# Patient Record
Sex: Female | Born: 1960 | Race: White | Hispanic: No | Marital: Married
Health system: Southern US, Community
[De-identification: ages and names within clinical notes are randomized; demographics above are authoritative.]

## PROBLEM LIST (undated history)

## (undated) HISTORY — PX: BREAST BIOPSY: SHX20

---

## 1998-11-21 ENCOUNTER — Inpatient Hospital Stay (HOSPITAL_COMMUNITY): Admission: AD | Admit: 1998-11-21 | Discharge: 1998-11-23 | Payer: Self-pay | Admitting: Obstetrics and Gynecology

## 1999-01-01 ENCOUNTER — Other Ambulatory Visit: Admission: RE | Admit: 1999-01-01 | Discharge: 1999-01-01 | Payer: Self-pay | Admitting: Obstetrics and Gynecology

## 2000-04-22 ENCOUNTER — Other Ambulatory Visit: Admission: RE | Admit: 2000-04-22 | Discharge: 2000-04-22 | Payer: Self-pay | Admitting: Obstetrics and Gynecology

## 2001-06-25 ENCOUNTER — Other Ambulatory Visit: Admission: RE | Admit: 2001-06-25 | Discharge: 2001-06-25 | Payer: Self-pay | Admitting: Obstetrics and Gynecology

## 2002-11-04 ENCOUNTER — Other Ambulatory Visit: Admission: RE | Admit: 2002-11-04 | Discharge: 2002-11-04 | Payer: Self-pay | Admitting: Obstetrics and Gynecology

## 2003-12-15 ENCOUNTER — Other Ambulatory Visit: Admission: RE | Admit: 2003-12-15 | Discharge: 2003-12-15 | Payer: Self-pay | Admitting: Obstetrics and Gynecology

## 2005-01-29 ENCOUNTER — Other Ambulatory Visit: Admission: RE | Admit: 2005-01-29 | Discharge: 2005-01-29 | Payer: Self-pay | Admitting: Obstetrics and Gynecology

## 2005-02-05 ENCOUNTER — Encounter: Admission: RE | Admit: 2005-02-05 | Discharge: 2005-02-05 | Payer: Self-pay | Admitting: Obstetrics and Gynecology

## 2006-02-23 ENCOUNTER — Other Ambulatory Visit: Admission: RE | Admit: 2006-02-23 | Discharge: 2006-02-23 | Payer: Self-pay | Admitting: Obstetrics and Gynecology

## 2006-07-22 ENCOUNTER — Encounter: Admission: RE | Admit: 2006-07-22 | Discharge: 2006-07-22 | Payer: Self-pay | Admitting: Obstetrics and Gynecology

## 2007-06-02 ENCOUNTER — Other Ambulatory Visit: Admission: RE | Admit: 2007-06-02 | Discharge: 2007-06-02 | Payer: Self-pay | Admitting: Obstetrics and Gynecology

## 2007-08-03 ENCOUNTER — Encounter: Admission: RE | Admit: 2007-08-03 | Discharge: 2007-08-03 | Payer: Self-pay | Admitting: Obstetrics and Gynecology

## 2007-08-10 ENCOUNTER — Encounter: Admission: RE | Admit: 2007-08-10 | Discharge: 2007-08-10 | Payer: Self-pay | Admitting: Obstetrics and Gynecology

## 2008-07-20 ENCOUNTER — Other Ambulatory Visit: Admission: RE | Admit: 2008-07-20 | Discharge: 2008-07-20 | Payer: Self-pay | Admitting: Obstetrics and Gynecology

## 2008-08-09 ENCOUNTER — Encounter: Admission: RE | Admit: 2008-08-09 | Discharge: 2008-08-09 | Payer: Self-pay | Admitting: Obstetrics and Gynecology

## 2008-10-23 ENCOUNTER — Emergency Department (HOSPITAL_BASED_OUTPATIENT_CLINIC_OR_DEPARTMENT_OTHER): Admission: EM | Admit: 2008-10-23 | Discharge: 2008-10-23 | Payer: Self-pay | Admitting: Emergency Medicine

## 2009-06-26 ENCOUNTER — Ambulatory Visit: Payer: Self-pay | Admitting: Vascular Surgery

## 2009-08-21 ENCOUNTER — Encounter: Admission: RE | Admit: 2009-08-21 | Discharge: 2009-08-21 | Payer: Self-pay | Admitting: Obstetrics and Gynecology

## 2009-08-24 ENCOUNTER — Other Ambulatory Visit: Admission: RE | Admit: 2009-08-24 | Discharge: 2009-08-24 | Payer: Self-pay | Admitting: Obstetrics and Gynecology

## 2009-09-27 ENCOUNTER — Encounter: Admission: RE | Admit: 2009-09-27 | Discharge: 2009-09-27 | Payer: Self-pay | Admitting: Family Medicine

## 2009-09-27 IMAGING — CR DG ANKLE 2V *R*
2 series · 2 of 2 positions shown · non-contrast
Comparison: None

CLINICAL DATA: Lateral ankle pain and swelling following twisting
injury 2 days ago.  Deltoid ligament sprain.

RIGHT ANKLE - 2 VIEW

[view not recorded (1 of 2)]
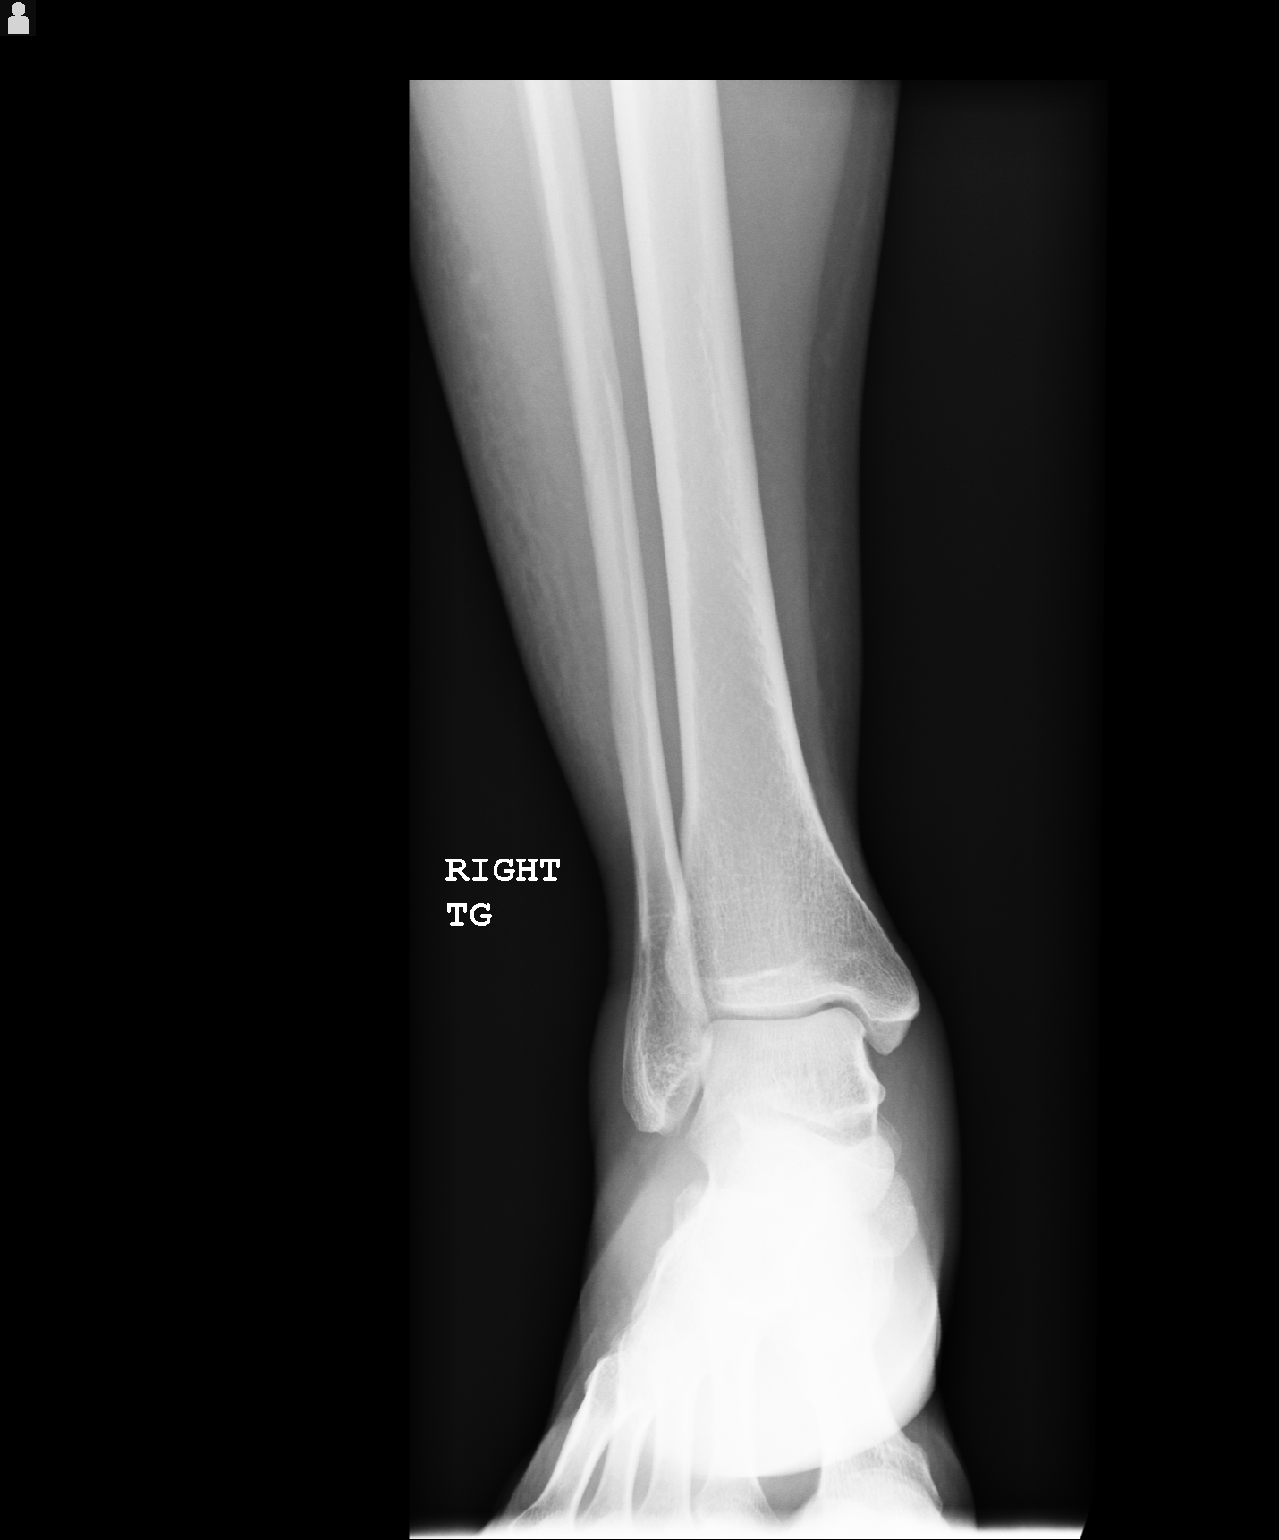

[view not recorded (2 of 2)]
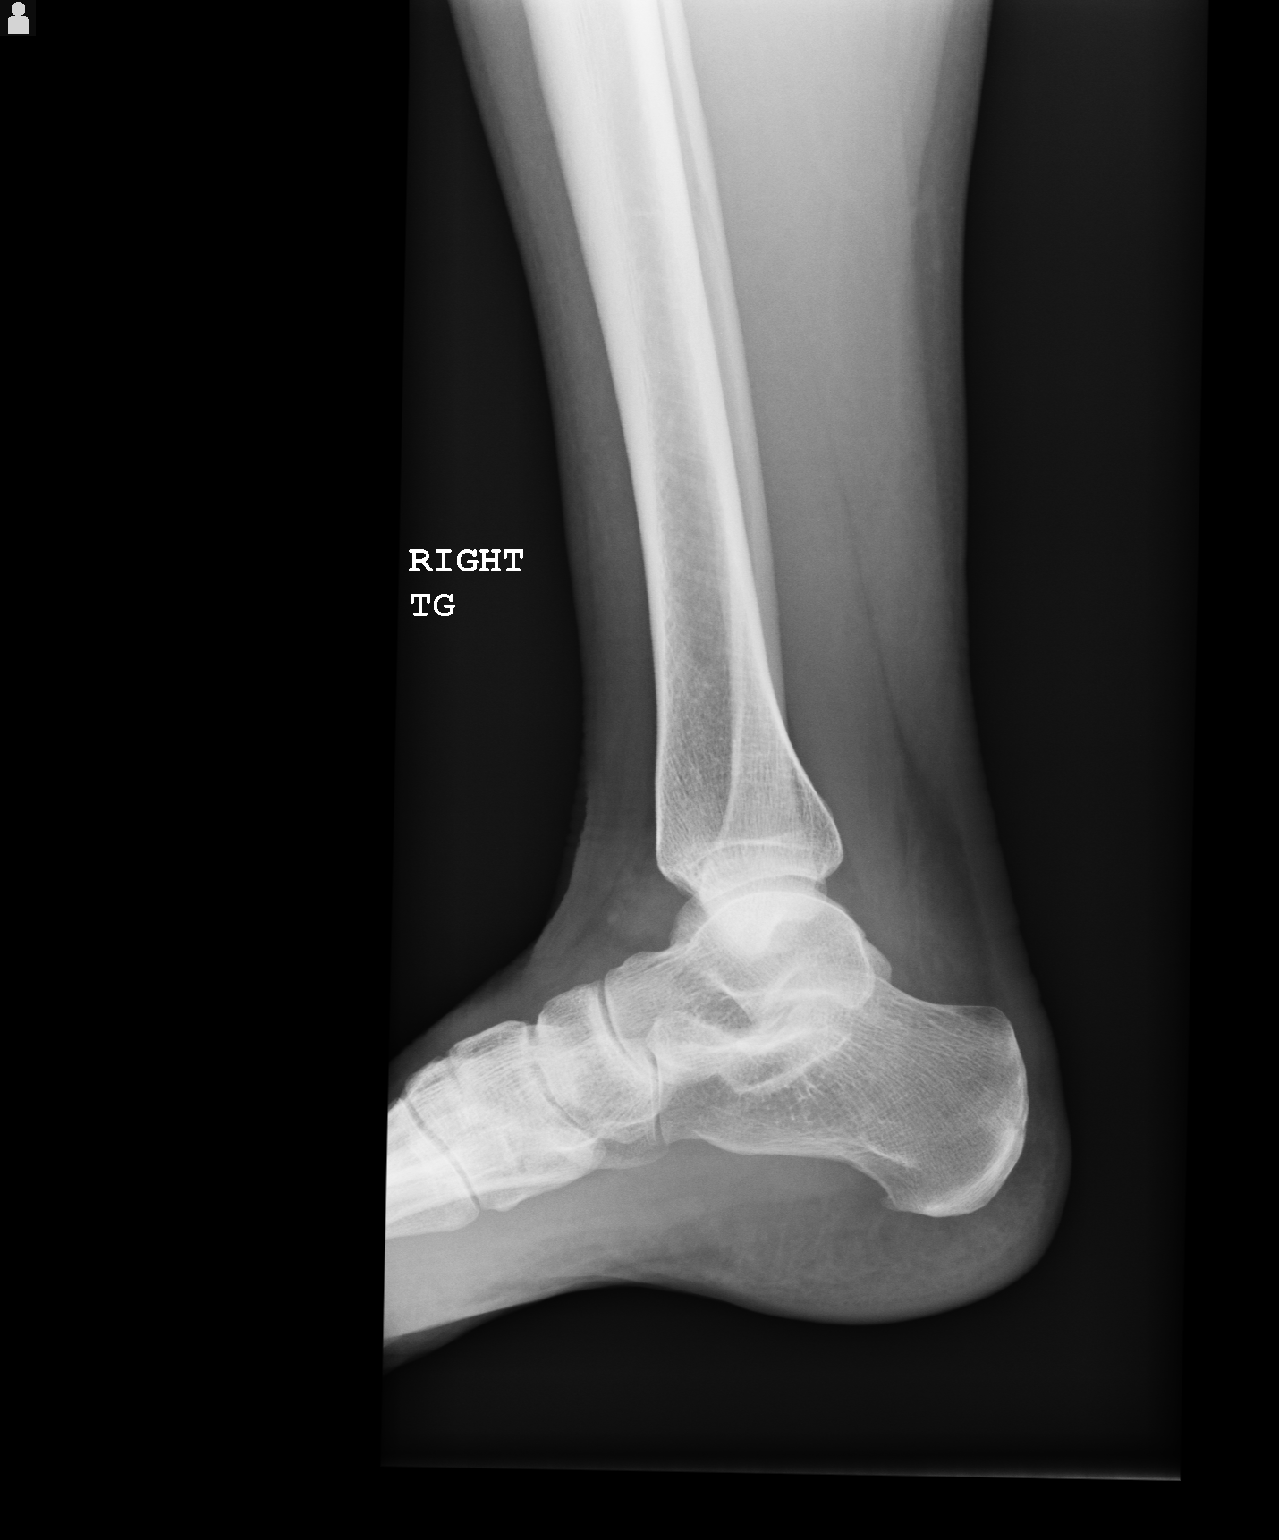

[2 of 2 positions shown; findings below may reference images not displayed]

FINDINGS: Mineralization and alignment are normal.  There is no
evidence of acute fracture or dislocation.  The joint spaces are
preserved.  There is a probable small ankle joint effusion.  Mild
lateral soft tissue swelling is noted at the ankle and in the
distal lower leg.  There may be mild swelling medially in the
region of the deltoid ligament as well.
IMPRESSION: No acute osseous findings.  Possible soft tissue swelling.

## 2009-10-11 ENCOUNTER — Ambulatory Visit: Payer: Self-pay | Admitting: Vascular Surgery

## 2010-08-22 ENCOUNTER — Encounter: Admission: RE | Admit: 2010-08-22 | Discharge: 2010-08-22 | Payer: Self-pay | Admitting: Obstetrics and Gynecology

## 2011-01-19 ENCOUNTER — Encounter: Payer: Self-pay | Admitting: Obstetrics and Gynecology

## 2011-05-13 NOTE — Consult Note (Signed)
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  She states over the last  5-10 years she has had some spider veins which have become more obvious  in the anterior thigh regions bilaterally as well as a string of veins  in the left posterior thigh extending around the knee.  She denies any  aching, throbbing, burning or stinging discomfort associated with this.  She has no distal edema, has no history of DVT or thrombophlebitis and  has had no history of ulceration or bleeding.  She does not wear elastic  compression stockings and has had no previous evaluation or treatment  for these.   PAST MEDICAL HISTORY:  Negative for diabetes, hypertension, coronary  artery disease, COPD or stroke.   PAST SURGICAL HISTORY:  1. C-section.  2. Tubal ligation.   FAMILY HISTORY:  Positive for varicose veins in her mother and sister  who has had a vein stripping.  Positive for diabetes in her mother and  coronary artery disease with surgery in her father.   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  Does not use tobacco or alcohol.   REVIEW OF SYSTEMS:  Totally unremarkable, ambulates long distances  without discomfort.  Otherwise, totally negative.   ALLERGIES:  None known.   MEDICATIONS:  Please see health history.   PHYSICAL EXAMINATION:  Vital signs:  Blood pressure 110/76, heart rate  65, respirations 14.  General:  She is a healthy-appearing middle-aged  female in no apparent distress, alert and oriented x3.  Neck:  Supple,  3+ carotid pulses palpable.  No bruits are audible.  Neurological:  Normal.  No palpable adenopathy in her neck.  Chest:  Clear to  auscultation.  Cardiovascular:  Regular rhythm.  No murmurs.  Abdomen:  Soft,  nontender with no masses.  She has 3+ femoral, popliteal and  dorsalis pedis pulses bilaterally.  Lower extremity exam reveals  prominent spider veins in both anterior thighs as well as a blush of  spider veins in the left pretibial region between the knee and the  ankle.  She also has large reticular or small varicose vein which begins  in the pretibial region on the left and extends up lateral to the knee  and across the posterior thigh area.  There are no medial varicosities  over the greater saphenous system and no varicosities over the lesser  saphenous system.  No hyperpigmentation or ulceration is noted.   I do not think she has severe valvular reflux based on her physical exam  and the best treatment for these would be sclerotherapy which we will  discuss at length with her today.  She would like to proceed and we will  schedule that in the near future.   Quita Skye Hart [place], M.D.  Electronically Signed   JDL/MEDQ  D:  06/26/2009  T:  06/27/2009  Job:  [id number]

## 2011-08-04 ENCOUNTER — Other Ambulatory Visit (HOSPITAL_COMMUNITY)
Admission: RE | Admit: 2011-08-04 | Discharge: 2011-08-04 | Disposition: A | Discharge status: Home / Self Care | Payer: PRIVATE HEALTH INSURANCE | Source: Ambulatory Visit | Attending: Obstetrics and Gynecology | Admitting: Obstetrics and Gynecology

## 2011-08-04 DIAGNOSIS — Z01419 Encounter for gynecological examination (general) (routine) without abnormal findings: Secondary | ICD-10-CM | POA: Insufficient documentation

## 2011-08-13 ENCOUNTER — Other Ambulatory Visit: Payer: Self-pay | Admitting: Obstetrics and Gynecology

## 2011-08-13 DIAGNOSIS — Z1231 Encounter for screening mammogram for malignant neoplasm of breast: Secondary | ICD-10-CM

## 2011-09-11 ENCOUNTER — Ambulatory Visit
Admission: RE | Admit: 2011-09-11 | Discharge: 2011-09-11 | Disposition: A | Discharge status: Home / Self Care | Payer: PRIVATE HEALTH INSURANCE | Source: Ambulatory Visit | Attending: Obstetrics and Gynecology | Admitting: Obstetrics and Gynecology

## 2011-09-11 DIAGNOSIS — Z1231 Encounter for screening mammogram for malignant neoplasm of breast: Secondary | ICD-10-CM

## 2011-09-19 ENCOUNTER — Other Ambulatory Visit: Payer: Self-pay | Admitting: Obstetrics and Gynecology

## 2011-09-19 DIAGNOSIS — R928 Other abnormal and inconclusive findings on diagnostic imaging of breast: Secondary | ICD-10-CM

## 2011-09-30 LAB — DIFFERENTIAL
Basophils Relative: 0
Eosinophils Relative: 0
Lymphs Abs: 4.9 — ABNORMAL HIGH
Monocytes Relative: 5
Neutro Abs: 7.3

## 2011-09-30 LAB — URINALYSIS, ROUTINE W REFLEX MICROSCOPIC
Hgb urine dipstick: NEGATIVE
Nitrite: NEGATIVE
Specific Gravity, Urine: 1.018
Urobilinogen, UA: 2 — ABNORMAL HIGH
pH: 6

## 2011-09-30 LAB — COMPREHENSIVE METABOLIC PANEL
AST: 320 — ABNORMAL HIGH
Albumin: 4.1
Calcium: 9.1
Creatinine, Ser: 0.6
GFR calc Af Amer: >60
GFR calc non Af Amer: >60
Sodium: 137
Total Protein: 7.4

## 2011-09-30 LAB — CBC
MCHC: 33.9
MCV: 93.4
Platelets: 233
RDW: 11.7

## 2011-10-07 ENCOUNTER — Ambulatory Visit
Admission: RE | Admit: 2011-10-07 | Discharge: 2011-10-07 | Disposition: A | Discharge status: Home / Self Care | Payer: PRIVATE HEALTH INSURANCE | Source: Ambulatory Visit | Attending: Obstetrics and Gynecology | Admitting: Obstetrics and Gynecology

## 2011-10-07 DIAGNOSIS — R928 Other abnormal and inconclusive findings on diagnostic imaging of breast: Secondary | ICD-10-CM

## 2012-11-23 ENCOUNTER — Other Ambulatory Visit: Payer: Self-pay | Admitting: Obstetrics and Gynecology

## 2012-11-23 DIAGNOSIS — Z1231 Encounter for screening mammogram for malignant neoplasm of breast: Secondary | ICD-10-CM

## 2013-01-04 ENCOUNTER — Ambulatory Visit
Admission: RE | Admit: 2013-01-04 | Discharge: 2013-01-04 | Disposition: A | Discharge status: Home / Self Care | Payer: BC Managed Care – PPO | Source: Ambulatory Visit | Attending: Obstetrics and Gynecology | Admitting: Obstetrics and Gynecology

## 2013-01-04 DIAGNOSIS — Z1231 Encounter for screening mammogram for malignant neoplasm of breast: Secondary | ICD-10-CM

## 2014-02-16 ENCOUNTER — Other Ambulatory Visit: Payer: Self-pay

## 2014-02-16 DIAGNOSIS — Z1231 Encounter for screening mammogram for malignant neoplasm of breast: Secondary | ICD-10-CM

## 2014-03-13 ENCOUNTER — Ambulatory Visit
Admission: RE | Admit: 2014-03-13 | Discharge: 2014-03-13 | Disposition: A | Discharge status: Home / Self Care | Payer: Self-pay | Source: Ambulatory Visit

## 2014-03-13 DIAGNOSIS — Z1231 Encounter for screening mammogram for malignant neoplasm of breast: Secondary | ICD-10-CM

## 2014-08-14 ENCOUNTER — Other Ambulatory Visit: Payer: Self-pay | Admitting: Obstetrics and Gynecology

## 2014-08-14 ENCOUNTER — Other Ambulatory Visit (HOSPITAL_COMMUNITY)
Admission: RE | Admit: 2014-08-14 | Discharge: 2014-08-14 | Disposition: A | Discharge status: Home / Self Care | Payer: BC Managed Care – PPO | Source: Ambulatory Visit | Attending: Obstetrics and Gynecology | Admitting: Obstetrics and Gynecology

## 2014-08-14 DIAGNOSIS — Z01419 Encounter for gynecological examination (general) (routine) without abnormal findings: Secondary | ICD-10-CM | POA: Diagnosis not present

## 2014-08-14 DIAGNOSIS — Z1151 Encounter for screening for human papillomavirus (HPV): Secondary | ICD-10-CM | POA: Insufficient documentation

## 2014-08-15 LAB — CYTOLOGY - PAP

## 2014-09-19 ENCOUNTER — Other Ambulatory Visit: Payer: Self-pay | Admitting: Obstetrics and Gynecology

## 2015-07-16 ENCOUNTER — Other Ambulatory Visit: Payer: Self-pay

## 2015-07-16 DIAGNOSIS — Z1231 Encounter for screening mammogram for malignant neoplasm of breast: Secondary | ICD-10-CM

## 2015-08-17 ENCOUNTER — Ambulatory Visit
Admission: RE | Admit: 2015-08-17 | Discharge: 2015-08-17 | Disposition: A | Discharge status: Home / Self Care | Payer: BLUE CROSS/BLUE SHIELD | Source: Ambulatory Visit

## 2015-08-17 ENCOUNTER — Ambulatory Visit: Payer: Self-pay

## 2015-08-17 DIAGNOSIS — Z1231 Encounter for screening mammogram for malignant neoplasm of breast: Secondary | ICD-10-CM

## 2015-08-21 ENCOUNTER — Other Ambulatory Visit: Payer: Self-pay | Admitting: Obstetrics and Gynecology

## 2015-08-21 DIAGNOSIS — R928 Other abnormal and inconclusive findings on diagnostic imaging of breast: Secondary | ICD-10-CM

## 2015-08-23 ENCOUNTER — Other Ambulatory Visit: Payer: BLUE CROSS/BLUE SHIELD

## 2015-08-28 ENCOUNTER — Ambulatory Visit
Admission: RE | Admit: 2015-08-28 | Discharge: 2015-08-28 | Disposition: A | Discharge status: Home / Self Care | Payer: BLUE CROSS/BLUE SHIELD | Source: Ambulatory Visit | Attending: Obstetrics and Gynecology | Admitting: Obstetrics and Gynecology

## 2015-08-28 ENCOUNTER — Other Ambulatory Visit: Payer: Self-pay | Admitting: Obstetrics and Gynecology

## 2015-08-28 DIAGNOSIS — R928 Other abnormal and inconclusive findings on diagnostic imaging of breast: Secondary | ICD-10-CM

## 2015-09-06 ENCOUNTER — Ambulatory Visit
Admission: RE | Admit: 2015-09-06 | Discharge: 2015-09-06 | Disposition: A | Discharge status: Home / Self Care | Payer: BLUE CROSS/BLUE SHIELD | Source: Ambulatory Visit | Attending: Obstetrics and Gynecology | Admitting: Obstetrics and Gynecology

## 2015-09-06 ENCOUNTER — Other Ambulatory Visit: Payer: Self-pay | Admitting: Obstetrics and Gynecology

## 2015-09-06 DIAGNOSIS — R928 Other abnormal and inconclusive findings on diagnostic imaging of breast: Secondary | ICD-10-CM

## 2016-02-20 ENCOUNTER — Other Ambulatory Visit: Payer: Self-pay | Admitting: Obstetrics and Gynecology

## 2016-02-20 DIAGNOSIS — N63 Unspecified lump in unspecified breast: Secondary | ICD-10-CM

## 2016-03-07 ENCOUNTER — Ambulatory Visit
Admission: RE | Admit: 2016-03-07 | Discharge: 2016-03-07 | Disposition: A | Discharge status: Home / Self Care | Payer: BLUE CROSS/BLUE SHIELD | Source: Ambulatory Visit | Attending: Obstetrics and Gynecology | Admitting: Obstetrics and Gynecology

## 2016-03-07 DIAGNOSIS — N63 Unspecified lump in unspecified breast: Secondary | ICD-10-CM

## 2016-09-02 ENCOUNTER — Other Ambulatory Visit: Payer: Self-pay | Admitting: Obstetrics and Gynecology

## 2016-09-02 DIAGNOSIS — Z1231 Encounter for screening mammogram for malignant neoplasm of breast: Secondary | ICD-10-CM

## 2016-09-10 ENCOUNTER — Ambulatory Visit
Admission: RE | Admit: 2016-09-10 | Discharge: 2016-09-10 | Disposition: A | Discharge status: Home / Self Care | Payer: PRIVATE HEALTH INSURANCE | Source: Ambulatory Visit | Attending: Obstetrics and Gynecology | Admitting: Obstetrics and Gynecology

## 2016-09-10 DIAGNOSIS — Z1231 Encounter for screening mammogram for malignant neoplasm of breast: Secondary | ICD-10-CM

## 2017-05-18 ENCOUNTER — Ambulatory Visit
Admission: RE | Admit: 2017-05-18 | Discharge: 2017-05-18 | Disposition: A | Discharge status: Home / Self Care | Payer: PRIVATE HEALTH INSURANCE | Source: Ambulatory Visit | Attending: Family Medicine | Admitting: Family Medicine

## 2017-05-18 ENCOUNTER — Other Ambulatory Visit: Payer: Self-pay | Admitting: Family Medicine

## 2017-05-18 DIAGNOSIS — S60052A Contusion of left little finger without damage to nail, initial encounter: Secondary | ICD-10-CM

## 2017-10-06 ENCOUNTER — Other Ambulatory Visit: Payer: Self-pay | Admitting: Obstetrics and Gynecology

## 2017-10-06 ENCOUNTER — Other Ambulatory Visit (HOSPITAL_COMMUNITY)
Admission: RE | Admit: 2017-10-06 | Discharge: 2017-10-06 | Disposition: A | Discharge status: Home / Self Care | Payer: PRIVATE HEALTH INSURANCE | Source: Ambulatory Visit | Attending: Obstetrics and Gynecology | Admitting: Obstetrics and Gynecology

## 2017-10-06 DIAGNOSIS — Z124 Encounter for screening for malignant neoplasm of cervix: Secondary | ICD-10-CM | POA: Insufficient documentation

## 2017-10-08 LAB — CYTOLOGY - PAP
DIAGNOSIS: NEGATIVE
HPV (WINDOPATH): NOT DETECTED

## 2017-10-09 ENCOUNTER — Other Ambulatory Visit: Payer: Self-pay | Admitting: Obstetrics and Gynecology

## 2017-10-09 DIAGNOSIS — Z1231 Encounter for screening mammogram for malignant neoplasm of breast: Secondary | ICD-10-CM

## 2017-10-30 ENCOUNTER — Ambulatory Visit
Admission: RE | Admit: 2017-10-30 | Discharge: 2017-10-30 | Disposition: A | Discharge status: Home / Self Care | Payer: PRIVATE HEALTH INSURANCE | Source: Ambulatory Visit | Attending: Obstetrics and Gynecology | Admitting: Obstetrics and Gynecology

## 2017-10-30 DIAGNOSIS — Z1231 Encounter for screening mammogram for malignant neoplasm of breast: Secondary | ICD-10-CM

## 2018-12-09 ENCOUNTER — Other Ambulatory Visit: Payer: Self-pay | Admitting: Obstetrics and Gynecology

## 2018-12-09 DIAGNOSIS — Z1231 Encounter for screening mammogram for malignant neoplasm of breast: Secondary | ICD-10-CM

## 2018-12-15 ENCOUNTER — Ambulatory Visit
Admission: RE | Admit: 2018-12-15 | Discharge: 2018-12-15 | Disposition: A | Discharge status: Home / Self Care | Payer: PRIVATE HEALTH INSURANCE | Source: Ambulatory Visit | Attending: Obstetrics and Gynecology | Admitting: Obstetrics and Gynecology

## 2018-12-15 DIAGNOSIS — Z1231 Encounter for screening mammogram for malignant neoplasm of breast: Secondary | ICD-10-CM

## 2019-10-31 ENCOUNTER — Other Ambulatory Visit: Payer: Self-pay | Admitting: Obstetrics and Gynecology

## 2019-10-31 DIAGNOSIS — Z1231 Encounter for screening mammogram for malignant neoplasm of breast: Secondary | ICD-10-CM

## 2019-10-31 DIAGNOSIS — M8588 Other specified disorders of bone density and structure, other site: Secondary | ICD-10-CM

## 2019-12-26 ENCOUNTER — Other Ambulatory Visit: Payer: Self-pay

## 2019-12-26 ENCOUNTER — Ambulatory Visit
Admission: RE | Admit: 2019-12-26 | Discharge: 2019-12-26 | Disposition: A | Discharge status: Home / Self Care | Payer: PRIVATE HEALTH INSURANCE | Source: Ambulatory Visit | Attending: Obstetrics and Gynecology | Admitting: Obstetrics and Gynecology

## 2019-12-26 DIAGNOSIS — Z1231 Encounter for screening mammogram for malignant neoplasm of breast: Secondary | ICD-10-CM

## 2020-01-23 ENCOUNTER — Other Ambulatory Visit: Payer: Self-pay

## 2020-01-23 ENCOUNTER — Ambulatory Visit
Admission: RE | Admit: 2020-01-23 | Discharge: 2020-01-23 | Disposition: A | Discharge status: Home / Self Care | Payer: PRIVATE HEALTH INSURANCE | Source: Ambulatory Visit | Attending: Obstetrics and Gynecology | Admitting: Obstetrics and Gynecology

## 2020-01-23 DIAGNOSIS — M8588 Other specified disorders of bone density and structure, other site: Secondary | ICD-10-CM

## 2020-12-07 ENCOUNTER — Other Ambulatory Visit: Payer: Self-pay | Admitting: Pediatrics

## 2020-12-07 DIAGNOSIS — Z1231 Encounter for screening mammogram for malignant neoplasm of breast: Secondary | ICD-10-CM

## 2021-01-18 ENCOUNTER — Other Ambulatory Visit: Payer: Self-pay

## 2021-01-18 ENCOUNTER — Ambulatory Visit: Payer: PRIVATE HEALTH INSURANCE

## 2021-01-18 ENCOUNTER — Ambulatory Visit
Admission: RE | Admit: 2021-01-18 | Discharge: 2021-01-18 | Disposition: A | Discharge status: Home / Self Care | Payer: PRIVATE HEALTH INSURANCE | Source: Ambulatory Visit | Attending: Pediatrics | Admitting: Pediatrics

## 2021-01-18 DIAGNOSIS — Z1231 Encounter for screening mammogram for malignant neoplasm of breast: Secondary | ICD-10-CM

## 2021-01-18 NOTE — Progress Notes (Signed)
Routed to wrong MD

## 2021-11-26 ENCOUNTER — Other Ambulatory Visit: Payer: Self-pay | Admitting: Nurse Practitioner

## 2021-11-26 DIAGNOSIS — M8580 Other specified disorders of bone density and structure, unspecified site: Secondary | ICD-10-CM

## 2022-01-03 ENCOUNTER — Other Ambulatory Visit: Payer: Self-pay | Admitting: Pediatrics

## 2022-01-03 DIAGNOSIS — Z1231 Encounter for screening mammogram for malignant neoplasm of breast: Secondary | ICD-10-CM

## 2022-01-24 ENCOUNTER — Ambulatory Visit
Admission: RE | Admit: 2022-01-24 | Discharge: 2022-01-24 | Disposition: A | Discharge status: Home / Self Care | Payer: No Typology Code available for payment source | Source: Ambulatory Visit | Attending: Pediatrics | Admitting: Pediatrics

## 2022-01-24 DIAGNOSIS — Z1231 Encounter for screening mammogram for malignant neoplasm of breast: Secondary | ICD-10-CM

## 2022-02-13 ENCOUNTER — Ambulatory Visit
Admission: RE | Admit: 2022-02-13 | Discharge: 2022-02-13 | Disposition: A | Discharge status: Home / Self Care | Payer: No Typology Code available for payment source | Source: Ambulatory Visit | Attending: Sports Medicine | Admitting: Sports Medicine

## 2022-02-13 ENCOUNTER — Other Ambulatory Visit: Payer: Self-pay

## 2022-02-13 ENCOUNTER — Other Ambulatory Visit: Payer: Self-pay | Admitting: Sports Medicine

## 2022-02-13 DIAGNOSIS — M25562 Pain in left knee: Secondary | ICD-10-CM

## 2022-02-13 DIAGNOSIS — M25561 Pain in right knee: Secondary | ICD-10-CM

## 2022-05-27 ENCOUNTER — Ambulatory Visit
Admission: RE | Admit: 2022-05-27 | Discharge: 2022-05-27 | Disposition: A | Discharge status: Home / Self Care | Payer: No Typology Code available for payment source | Source: Ambulatory Visit | Attending: Nurse Practitioner | Admitting: Nurse Practitioner

## 2022-05-27 DIAGNOSIS — M8580 Other specified disorders of bone density and structure, unspecified site: Secondary | ICD-10-CM

## 2023-01-09 ENCOUNTER — Other Ambulatory Visit: Payer: Self-pay | Admitting: Pediatrics

## 2023-01-09 DIAGNOSIS — Z1231 Encounter for screening mammogram for malignant neoplasm of breast: Secondary | ICD-10-CM

## 2023-01-27 ENCOUNTER — Ambulatory Visit
Admission: RE | Admit: 2023-01-27 | Discharge: 2023-01-27 | Disposition: A | Discharge status: Home / Self Care | Payer: No Typology Code available for payment source | Source: Ambulatory Visit | Attending: Pediatrics | Admitting: Pediatrics

## 2023-01-27 ENCOUNTER — Ambulatory Visit: Payer: No Typology Code available for payment source

## 2023-01-27 DIAGNOSIS — Z1231 Encounter for screening mammogram for malignant neoplasm of breast: Secondary | ICD-10-CM

## 2023-12-03 ENCOUNTER — Other Ambulatory Visit: Payer: Self-pay | Admitting: Internal Medicine

## 2023-12-03 DIAGNOSIS — M810 Age-related osteoporosis without current pathological fracture: Secondary | ICD-10-CM

## 2024-01-20 ENCOUNTER — Other Ambulatory Visit: Payer: Self-pay | Admitting: Pediatrics

## 2024-01-20 DIAGNOSIS — Z0000 Encounter for general adult medical examination without abnormal findings: Secondary | ICD-10-CM

## 2024-01-26 ENCOUNTER — Other Ambulatory Visit: Payer: Self-pay | Admitting: Family Medicine

## 2024-01-26 DIAGNOSIS — R1310 Dysphagia, unspecified: Secondary | ICD-10-CM

## 2024-02-03 ENCOUNTER — Ambulatory Visit
Admission: RE | Admit: 2024-02-03 | Discharge: 2024-02-03 | Disposition: A | Discharge status: Home / Self Care | Payer: No Typology Code available for payment source | Source: Ambulatory Visit | Attending: Pediatrics | Admitting: Pediatrics

## 2024-02-03 DIAGNOSIS — Z0000 Encounter for general adult medical examination without abnormal findings: Secondary | ICD-10-CM

## 2024-03-01 ENCOUNTER — Ambulatory Visit
Admission: RE | Admit: 2024-03-01 | Discharge: 2024-03-01 | Disposition: A | Discharge status: Home / Self Care | Payer: No Typology Code available for payment source | Source: Ambulatory Visit | Attending: Family Medicine | Admitting: Family Medicine

## 2024-03-01 DIAGNOSIS — R1310 Dysphagia, unspecified: Secondary | ICD-10-CM

## 2024-08-12 ENCOUNTER — Other Ambulatory Visit: Payer: No Typology Code available for payment source

## 2024-09-19 ENCOUNTER — Ambulatory Visit (HOSPITAL_BASED_OUTPATIENT_CLINIC_OR_DEPARTMENT_OTHER)
Admission: RE | Admit: 2024-09-19 | Discharge: 2024-09-19 | Disposition: A | Discharge status: Home / Self Care | Source: Ambulatory Visit | Attending: Internal Medicine | Admitting: Internal Medicine

## 2024-09-19 DIAGNOSIS — M810 Age-related osteoporosis without current pathological fracture: Secondary | ICD-10-CM | POA: Diagnosis present
